# Patient Record
Sex: Male | Born: 1940
Health system: Southern US, Community
[De-identification: ages and names within clinical notes are randomized; demographics above are authoritative.]

## PROBLEM LIST (undated history)

## (undated) DIAGNOSIS — I10 Essential (primary) hypertension: Secondary | ICD-10-CM

## (undated) DIAGNOSIS — I2510 Atherosclerotic heart disease of native coronary artery without angina pectoris: Secondary | ICD-10-CM

## (undated) DIAGNOSIS — I7090 Unspecified atherosclerosis: Secondary | ICD-10-CM

## (undated) DIAGNOSIS — R451 Restlessness and agitation: Secondary | ICD-10-CM

## (undated) DIAGNOSIS — I498 Other specified cardiac arrhythmias: Secondary | ICD-10-CM

## (undated) DIAGNOSIS — F17200 Nicotine dependence, unspecified, uncomplicated: Secondary | ICD-10-CM

## (undated) DIAGNOSIS — E119 Type 2 diabetes mellitus without complications: Secondary | ICD-10-CM

## (undated) DIAGNOSIS — E785 Hyperlipidemia, unspecified: Secondary | ICD-10-CM

## (undated) DIAGNOSIS — I209 Angina pectoris, unspecified: Secondary | ICD-10-CM

## (undated) DIAGNOSIS — C069 Malignant neoplasm of mouth, unspecified: Secondary | ICD-10-CM

## (undated) DIAGNOSIS — Z951 Presence of aortocoronary bypass graft: Secondary | ICD-10-CM

## (undated) DIAGNOSIS — M1990 Unspecified osteoarthritis, unspecified site: Secondary | ICD-10-CM

## (undated) DIAGNOSIS — F329 Major depressive disorder, single episode, unspecified: Secondary | ICD-10-CM

## (undated) DIAGNOSIS — G309 Alzheimer's disease, unspecified: Secondary | ICD-10-CM

## (undated) DIAGNOSIS — F0280 Dementia in other diseases classified elsewhere without behavioral disturbance: Secondary | ICD-10-CM

## (undated) DIAGNOSIS — E538 Deficiency of other specified B group vitamins: Secondary | ICD-10-CM

## (undated) HISTORY — DX: Presence of aortocoronary bypass graft: Z95.1

## (undated) HISTORY — DX: Angina pectoris, unspecified: I20.9

## (undated) HISTORY — PX: OTHER SURGICAL HISTORY: SHX169

## (undated) HISTORY — DX: Atherosclerotic heart disease of native coronary artery without angina pectoris: I25.10

## (undated) HISTORY — DX: Unspecified osteoarthritis, unspecified site: M19.90

## (undated) HISTORY — DX: Deficiency of other specified B group vitamins: E53.8

## (undated) HISTORY — DX: Unspecified atherosclerosis: I70.90

## (undated) HISTORY — DX: Nicotine dependence, unspecified, uncomplicated: F17.200

## (undated) HISTORY — DX: Essential (primary) hypertension: I10

## (undated) HISTORY — DX: Malignant neoplasm of mouth, unspecified: C06.9

## (undated) HISTORY — DX: Other specified cardiac arrhythmias: I49.8

## (undated) HISTORY — DX: Type 2 diabetes mellitus without complications: E11.9

## (undated) HISTORY — DX: Restlessness and agitation: R45.1

## (undated) HISTORY — DX: Dementia in other diseases classified elsewhere, unspecified severity, without behavioral disturbance, psychotic disturbance, mood disturbance, and anxiety: F02.80

## (undated) HISTORY — DX: Hyperlipidemia, unspecified: E78.5

## (undated) HISTORY — PX: CORONARY ARTERY BYPASS GRAFT: SHX141

## (undated) HISTORY — DX: Alzheimer's disease, unspecified: G30.9

## (undated) HISTORY — DX: Major depressive disorder, single episode, unspecified: F32.9

---

## 2001-08-03 ENCOUNTER — Encounter: Admission: RE | Admit: 2001-08-03 | Discharge: 2001-08-03 | Payer: Self-pay | Admitting: Unknown Physician Specialty

## 2001-08-03 ENCOUNTER — Encounter: Payer: Self-pay | Admitting: Unknown Physician Specialty

## 2001-08-24 ENCOUNTER — Inpatient Hospital Stay (HOSPITAL_COMMUNITY): Admission: EM | Admit: 2001-08-24 | Discharge: 2001-08-26 | Payer: Self-pay | Admitting: Emergency Medicine

## 2004-12-07 ENCOUNTER — Ambulatory Visit: Payer: Self-pay

## 2005-09-30 ENCOUNTER — Ambulatory Visit: Payer: Self-pay | Admitting: Cardiology

## 2006-03-07 ENCOUNTER — Ambulatory Visit: Payer: Self-pay | Admitting: Cardiology

## 2006-10-03 ENCOUNTER — Ambulatory Visit: Payer: Self-pay

## 2007-04-10 ENCOUNTER — Ambulatory Visit: Payer: Self-pay | Admitting: Cardiology

## 2007-04-10 LAB — CONVERTED CEMR LAB
ALT: 17 units/L (ref 0–40)
AST: 20 units/L (ref 0–37)
Albumin: 3.5 g/dL (ref 3.5–5.2)
Alkaline Phosphatase: 41 units/L (ref 39–117)
Bilirubin, Direct: 0.1 mg/dL (ref 0.0–0.3)
Cholesterol: 119 mg/dL (ref 0–200)
HDL: 40.3 mg/dL (ref >39.0)
LDL Cholesterol: 65 mg/dL (ref 0–99)
Total Bilirubin: 1.1 mg/dL (ref 0.3–1.2)
Total CHOL/HDL Ratio: 3
Total Protein: 6 g/dL (ref 6.0–8.3)
Triglycerides: 69 mg/dL (ref 0–149)
VLDL: 14 mg/dL (ref 0–40)

## 2007-10-02 ENCOUNTER — Ambulatory Visit: Payer: Self-pay

## 2008-04-22 ENCOUNTER — Ambulatory Visit: Payer: Self-pay | Admitting: Cardiology

## 2009-03-14 ENCOUNTER — Telehealth: Payer: Self-pay | Admitting: Cardiology

## 2009-04-12 ENCOUNTER — Encounter: Payer: Self-pay | Admitting: Cardiology

## 2009-06-26 ENCOUNTER — Encounter: Payer: Self-pay | Admitting: Cardiology

## 2009-06-26 DIAGNOSIS — Z951 Presence of aortocoronary bypass graft: Secondary | ICD-10-CM | POA: Insufficient documentation

## 2009-06-26 DIAGNOSIS — E785 Hyperlipidemia, unspecified: Secondary | ICD-10-CM | POA: Insufficient documentation

## 2009-06-26 DIAGNOSIS — I2510 Atherosclerotic heart disease of native coronary artery without angina pectoris: Secondary | ICD-10-CM | POA: Insufficient documentation

## 2009-06-26 HISTORY — DX: Presence of aortocoronary bypass graft: Z95.1

## 2009-06-26 HISTORY — DX: Atherosclerotic heart disease of native coronary artery without angina pectoris: I25.10

## 2009-06-26 HISTORY — DX: Hyperlipidemia, unspecified: E78.5

## 2009-06-27 ENCOUNTER — Ambulatory Visit: Payer: Self-pay | Admitting: Cardiology

## 2009-07-04 ENCOUNTER — Telehealth (INDEPENDENT_AMBULATORY_CARE_PROVIDER_SITE_OTHER): Payer: Self-pay | Admitting: *Deleted

## 2009-07-05 ENCOUNTER — Encounter: Payer: Self-pay | Admitting: Cardiology

## 2009-07-05 ENCOUNTER — Ambulatory Visit: Payer: Self-pay

## 2009-07-12 ENCOUNTER — Telehealth: Payer: Self-pay | Admitting: Cardiology

## 2009-07-23 ENCOUNTER — Encounter: Payer: Self-pay | Admitting: Cardiology

## 2009-07-24 ENCOUNTER — Ambulatory Visit: Payer: Self-pay | Admitting: Cardiology

## 2010-10-02 ENCOUNTER — Ambulatory Visit: Payer: Self-pay | Admitting: Cardiology

## 2010-10-02 DIAGNOSIS — F17200 Nicotine dependence, unspecified, uncomplicated: Secondary | ICD-10-CM

## 2010-10-02 DIAGNOSIS — I498 Other specified cardiac arrhythmias: Secondary | ICD-10-CM | POA: Insufficient documentation

## 2010-10-02 HISTORY — DX: Other specified cardiac arrhythmias: I49.8

## 2010-10-02 HISTORY — DX: Nicotine dependence, unspecified, uncomplicated: F17.200

## 2010-10-19 ENCOUNTER — Ambulatory Visit: Payer: Self-pay

## 2010-11-13 ENCOUNTER — Encounter: Payer: Self-pay | Admitting: Cardiology

## 2010-12-06 NOTE — Assessment & Plan Note (Signed)
Summary: f1y   Visit Type:  Follow-up Primary Provider:  Junious Dresser, MD  CC:  CAD.  History of Present Illness: The patient is seen for followup of coronary artery disease.  He underwent CABG in 1994.  He received PCI to 2 vessels in 2002.  Nuclear scan in 2008 and September, 2010 revealed no ischemia.  The patient does have aches and pains currently.  He has significant neck pain and he is receiving pain meds for this.  He has knee pain also.  This does not sound like claudication.  He has pain in his left anterior chest that occurs intermittently.  It is very short-lived "jolt of pain" that does not last for a long time.  I doubt that this represents ischemia.  Current Medications (verified): 1)  Lisinopril 10 Mg Tabs (Lisinopril) .... Take 1 Tablet By Mouth Once A Day 2)  Nexium 40 Mg Cpdr (Esomeprazole Magnesium) .... Take One Tablet By Mouth Once Daily. 3)  Tramadol Hcl 50 Mg Tabs (Tramadol Hcl) .... Prn 4)  Simvastatin 10 Mg Tabs (Simvastatin) .... Take One Tablet By Mouth Daily At Bedtime 5)  Aspirin 81 Mg Tbec (Aspirin) .... Take One Tablet By Mouth Daily 6)  Namenda 10 Mg Tabs (Memantine Hcl) .... Once Daily 7)  Hydrocodone-Acetaminophen 5-500 Mg Tabs (Hydrocodone-Acetaminophen) .... Every 6 Hrs As Needed 8)  Nitrostat 0.4 Mg Subl (Nitroglycerin) .Marland Kitchen.. 1 Tablet Under Tongue At Onset of Chest Pain; You May Repeat Every 5 Minutes For Up To 3 Doses.  Allergies (verified): No Known Drug Allergies  Past History:  Past Medical History: CAD....PCI  2002 ..(.RCA & Cx).......nuclear normal 09/2007.. / nuclear September, 2010.Marland Kitchen abnormal EKGs but no ischemia.Marland Kitchen ejection fraction 55% CABG  1994 Dyslipidemia LV  normal  by nuclear 2008 Arthritic pain in multiple joints  including neck and knees.... November, 2011 Tobacco abuse  Review of Systems       Patient denies fever, chills, headache, sweats, rash, change in vision, change in hearing, cough, nausea vomiting, urinary  symptoms.  All other systems are reviewed and are negative.  Vital Signs:  Patient profile:   70 year old male Height:      68 inches Weight:      154 pounds BMI:     23.50 Pulse rate:   52 / minute BP sitting:   160 / 70  (left arm) Cuff size:   regular  Vitals Entered By: Hardin Negus, RMA (October 02, 2010 9:15 AM)   Physical Exam  General:  patient is stable. Head:  head is atraumatic. Eyes:  no xanthelasma. Neck:  no jugular venous distention.  Questionable soft left carotid bruit. Chest Wall:  no chest wall tenderness. Lungs:  lungs are clear.  Respiratory effort is nonlabored. Heart:  cardiac exam reveals S1 and S2.  No clicks or significant murmurs. Abdomen:  abdomen is soft. Msk:  no musculoskeletal deformities. Extremities:  no peripheral edema. Skin:  no skin rashes Psych:  patient is oriented to person time and place affect is normal.  He is here with his wife today.   Impression & Recommendations:  Problem # 1:  * SOFT LEFT CAROTID BRUIT the patient does have a soft left carotid bruit.  I have no record of carotid Dopplers being done in many years.  Carotid Doppler will be arranged.  Problem # 2:  BRADYCARDIA (ICD-427.89)  His updated medication list for this problem includes:    Lisinopril 10 Mg Tabs (Lisinopril) .Marland Kitchen... Take 1 tablet by mouth  once a day    Aspirin 81 Mg Tbec (Aspirin) .Marland Kitchen... Take one tablet by mouth daily    Nitrostat 0.4 Mg Subl (Nitroglycerin) .Marland Kitchen... 1 tablet under tongue at onset of chest pain; you may repeat every 5 minutes for up to 3 doses.  The patient has sinus bradycardia.  Is not having symptoms.  He is not on medications that will slow his heart rate.  No change in therapy.  Problem # 3:  * CHEST WALL PAIN This chest pain does not appear to be ischemic.  No further workup.  Problem # 4:  DYSLIPIDEMIA (ICD-272.4)  The following medications were removed from the medication list:    Lipitor 10 Mg Tabs (Atorvastatin calcium)  .Marland Kitchen... Take one tablet by mouth daily. His updated medication list for this problem includes:    Simvastatin 10 Mg Tabs (Simvastatin) .Marland Kitchen... Take one tablet by mouth daily at bedtime The patient is on medication for his lipids.this is followed by his primary physician.  Problem # 5:  CAD (ICD-414.00)  His updated medication list for this problem includes:    Lisinopril 10 Mg Tabs (Lisinopril) .Marland Kitchen... Take 1 tablet by mouth once a day    Aspirin 81 Mg Tbec (Aspirin) .Marland Kitchen... Take one tablet by mouth daily    Nitrostat 0.4 Mg Subl (Nitroglycerin) .Marland Kitchen... 1 tablet under tongue at onset of chest pain; you may repeat every 5 minutes for up to 3 doses.  Orders: EKG w/ Interpretation (93000) Coronary disease is stable.  He had a nuclear exercise study in 2010 that showed no ischemia.  LV function has been normal by history and at the time of his nuclear studies.  EKG is done today and reviewed by me.  There is sinus bradycardia.  No other abnormalities are noted.  Carotid Doppler will be obtained.  We will be in touch with him about the information.  We'll see him back in one year for cardiology followup.  Problem # 6:  TOBACCO ABUSE (ICD-305.1) The patient is using tobacco products.  He is counseled to stop.  Other Orders: Carotid Duplex (Carotid Duplex)  Patient Instructions: 1)  Your physician has requested that you have a carotid duplex. This test is an ultrasound of the carotid arteries in your neck. It looks at blood flow through these arteries that supply the brain with blood. Allow one hour for this exam. There are no restrictions or special instructions. 2)  Your physician wants you to follow-up in:  1 year.  You will receive a reminder letter in the mail two months in advance. If you don't receive a letter, please call our office to schedule the follow-up appointment.

## 2011-03-19 NOTE — Assessment & Plan Note (Signed)
[facility]                            CARDIOLOGY OFFICE NOTE   NAME:Brandon Shepherd, Brandon Shepherd                      MRN:          [id number]  DATE:04/22/2008                            DOB:          Jun 17, 1941    Brandon Shepherd has coronary disease.  He had been followed by Dr. Samule Ohm of  our group who has moved to [place].  I will provide his cardiology  care going forward.  Brandon Shepherd underwent CABG in 1994 and later had  stenting of a native right coronary artery and circumflex in October  2002.  He does very well.  He is quite stable.  His last Myoview scan  was done in November 2008, and showed no ischemia.  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  He has not had any chest pain.  He has no shortness of breath.  He has  some arthritis in his hands and his elbows.  He has had no syncope or  presyncope.   PAST MEDICAL HISTORY:   ALLERGIES:  No known drug allergies.   MEDICATIONS:  1. Lipitor 10.  2. Aspirin 81.  3. Fish oil.  4. Nexium.  We will reverify whether he is or is not on an ACE inhibitor.   OTHER MEDICAL PROBLEMS:  See the list below.   REVIEW OF SYSTEMS:  Other than the HPI.  He really has no significant  complaints.   REVIEW OF SYSTEMS:  Otherwise is negative.   PHYSICAL EXAMINATION:  VITAL SIGNS:  Weight is 142 pounds, which is down  a few pounds since last year.  Blood pressure is 103/58 with a pulse of  53.  GENERAL:  The patient is oriented to person, time, and place.  Affect is normal.  HEENT:  No xanthelasma.  He has normal extraocular motion.  NECK:  There are no carotid bruits.  There is no jugular venous  distention.  LUNGS:  Clear.  Respiratory effort is not labored.  CARDIAC:  S1 with an S2.  There are no clicks or significant murmurs.  ABDOMEN:  Soft.  EXTREMITIES:  He has no peripheral edema.  He does have some bony  changes of osteoarthritis in his hands.   EKG  reveals sinus bradycardia.  There are no significant QRS changes.   PROBLEMS:  1. Coronary disease post coronary artery bypass graft and subsequent      percutaneous coronary intervention.  He is stable.  He does not      have any significant symptoms.  He will have his next exercise test      done, if necessary based on his need for a DOT evaluation.  2. Hypercholesterolemia.  His Lipitor dose has been lowered because of      some symptoms.  His labs are followed in [place].  Of course, we      would hope to try to keep to get his LDL into the 70-80 range, if      possible.   Brandon Shepherd is stable.  He does not  need any further workup at this  time.     Luis Abed, MD, [facility]  Electronically Signed    JDK/MedQ  DD: 04/22/2008  DT: 04/22/2008  Job #: [id number]   cc:   Lewis Moccasin

## 2011-03-19 NOTE — Assessment & Plan Note (Signed)
[facility]                            CARDIOLOGY OFFICE NOTE   NAME:Brandon Shepherd, Brandon Shepherd                      MRN:          [id number]  DATE:04/10/2007                            DOB:          1941/08/16    PRIMARY CARE PHYSICIAN:  Dr. Lewis Moccasin.   HISTORY OF PRESENT ILLNESS:  Brandon Shepherd is a 70 year old gentleman who  is status post coronary artery bypass grafting in 1994 and subsequent  stenting of the native right coronary and circumflex arteries in October  2002. He continues to do very well. He works driving his truck. He has  not had any episodes of chest discomfort, PND, orthopnea, edema,  syncope, presyncope or symptoms concerning for TIA or stroke.   He has recently had a prostatitis from which he is recovering nicely.   CURRENT MEDICATIONS:  1. Nexium 40 mg daily.  2. Enteric coated aspirin 81 mg daily.  3. Vitamin C.  4. Calcium.  5. Vitamin D.  6. Lipitor 20 mg daily.  7. Altace 5 mg daily.  8. Levaquin 750 mg daily.   PHYSICAL EXAMINATION:  GENERAL:  He is generally well-appearing and thin  in no distress.  VITAL SIGNS:  Heart rate 55, blood pressure 98/60 and weight of 148  pounds. Weight is down 14 pounds from 18 months ago.  NECK:  He has no jugular venous distention, thyromegaly or  lymphadenopathy.  LUNGS:  Respiratory effort is normal. Lungs are clear to auscultation.  CARDIAC:  He has a nondisplaced point of maximal cardiac impulse. There  is a regular rate and rhythm without murmurs, rubs or gallops.  ABDOMEN:  Soft, nondistended, nontender. There is no hepatosplenomegaly.  Bowel sounds are normal. There is no evidence of a pulsatile mass.  EXTREMITIES:  Warm and without edema.  PULSES:  Carotid pulses are 2+ bilaterally without bruits.   IMPRESSION/RECOMMENDATIONS:  1. Coronary disease:  Doing nicely after a coronary artery bypass      graft and subsequent percutaneous revascularization of the right      coronary and  circumflex. Ejection fraction is normal. Will continue      aspirin. To save him money, we will switch from Altace to      lisinopril 10 mg daily.  2. Hypercholesterolemia:  Check lipids and LFTs today. Will switch      from Lipitor to Simvastatin 40 mg daily for cost savings.     Salvadore Farber, MD  Electronically Signed    WED/MedQ  DD: 04/10/2007  DT: 04/10/2007  Job #: [id number]   cc:   Lewis Moccasin

## 2011-03-22 NOTE — Cardiovascular Report (Signed)
[place]. [facility]  Patient:    Brandon Shepherd, Brandon Shepherd Visit Number: [id number] MRN: [id number]          Service Type: MED Location: [phone] Attending Physician:  Daisey Must Dictated by:   Everardo Beals Juanda Chance, M.D. [facility] Proc. Date: 08/25/01 Admit Date:  08/24/2001   CC:         Daisey Must, M.D. [facility]  Cardiopulmonary Laboratory   Cardiac Catheterization  PROCEDURES PERFORMED: Cardiac catheterization and percutaneous coronary intervention.  CLINICAL HISTORY: The patient is 70 years old and had bypass surgery in 1996. He has had symptoms of exertional angina for several weeks and recently had a Cardiolite scan which showed inferior ischemia. He was admitted through the office by Jerral Bonito and Dian Queen yesterday.  DESCRIPTION OF PROCEDURE: The procedure was performed via the right femoral artery using an arterial sheath and 6 French preformed coronary catheters.  A front wall arterial puncture was performed and Omnipaque contrast was used. A LIMA catheter was used for injection of the LIMA graft. A left bypass graft catheter was used for injection of the vein graft to the diagonal and ramus and a 7 [place] guiding catheter with side holes was used for injection of the right coronary artery. After completion of the diagnostic study, we made a decision to proceed with intervention on the right coronary artery and distal circumflex artery.  The patient was given weight-adjusted heparin to prolong the ACT to greater than 200 seconds and was given double bolus Integrilin and infusion. We used a 7 [place] guiding catheter with side holes and a short luge wire.  We crossed the lesion in the mid to right coronary artery without much difficulty.  We initially pre-dilated with a 2.25 x 20 mm Maverick performing one inflation of 8 atmospheres for 40 seconds.  We then deployed a 2.5 x 28 mm Pixel stent with one inflation of 12 atmospheres for 53  seconds.  Repeat diagnostic studies were then performed with the guiding catheter.  We then approached the circumflex artery. We used a 7 [place] 4.0 Voda guiding catheter with side holes. We used a short luge wire. We crossed the lesion with the wire without much difficulty. We initially pre-dilated with the 2.25 x 23 mm Maverick performing one inflation of 8 atmospheres for 31 seconds.  We then deployed a 2.5 x 12 mm Express stent. The deployment was very difficult because the stent had marked motion prior to deployment. Following deployment of the stent, the stent was deployed more distally than intended and we had to pass a second 2.75 x 8 mm Express stent. We deployed this overlapping the first stent. This also was difficult to deploy because of motion of the stent within the vessel and we got it slightly more distal than desired but felt that we covered the lesion.  The patient tolerated the procedure well and left the laboratory in satisfactory condition.  RESULTS: The aortic pressure was 121/70 with a mean of 88. Left ventricular pressure was 121/15.  The left main coronary artery: The left main coronary artery was free of significant disease.  Left anterior descending: The left anterior descending artery was completely occluded.  Circumflex artery: The circumflex artery was a codominant vessel that gave rise to a ramus branch which was completely occluded, a small marginal branch, a second marginal branch, and a large posterolateral branch. There was 70% narrowing in the small marginal branch. There was 90% narrowing in the distal  circumflex artery before the posterolateral branch.  The saphenous vein graft to the diagonal branch of the LAD and intermediate branch of the circumflex artery was patent without stenoses. There was 70% narrowing just distal to the anastomosis of the ramus branch.  The LIMA graft to the LAD was patent and functioned well and there was  no significant disease in the distal LAD.  LEFT VENTRICULOGRAPHY: The left ventriculogram performed in the RAO projection showed good wall motion with no areas of hypokinesis. The estimated ejection fraction was 60%.  Following stenting of the mid to distal right coronary lesion the stenosis improved from 90% to 0%.  Following placement of tandem overlying stents in the distal circumflex artery the stenosis improved from 90% to less than 15%.  CONCLUSIONS: 1. Coronary artery disease, status post coronary artery bypass graft surgery    in 1996 with total occlusion of the native left anterior descending, 70%    narrowing in the first marginal branch of the circumflex artery and 90%    stenosis in the distal circumflex artery, 90% stenosis in the mid to distal    right coronary artery, a patent vein graft to the diagonal branch of the    left anterior descending and the ramus branch of the circumflex artery with    70% narrowing after the anastomosis to the ramus branch, and a patent    left internal mammary artery graft to the left anterior descending with    good left ventricular function. 2. Successful stent deployment in the mid to distal right coronary artery with    improvement in percent diameter narrowing from 90% to 0%. 3. Successful placement of tandem overlying stents in the distal circumflex    artery with improvement in percent diameter narrowing from 90% to less    than 15%.  DISPOSITION: The patient was returned to the postangioplasty unit for further observation. The patient will have a higher intense of recurrence at followup because of the long length of the stent in the right coronary artery with a fairly small diameter vessel and because of the need to place tandem overlying stents in the distal circumflex artery. Dictated by:   Everardo Beals Juanda Chance, M.D. [facility] Attending Physician:  Daisey Must DD:  08/25/01 TD:  08/26/01 Job: [id number]

## 2011-03-22 NOTE — Discharge Summary (Signed)
[place]. [facility]  Patient:    Brandon Shepherd, Brandon Shepherd Visit Number: [id number] MRN: [id number]          Service Type: MED Location: [phone] Attending Physician:  Daisey Must Dictated by:   Tereso Newcomer, P.A. Admit Date:  08/24/2001 Disc. Date: 08/26/01   CC:         Dr. Barnabas Lister                           Discharge Summary  DATE OF BIRTH:  [birth year]  REASON FOR ADMISSION:  Unstable angina.  DISCHARGE DIAGNOSES: 1. Coronary artery disease. 2. Status post coronary artery bypass grafting in 1994 by Gwenith Daily. Tyrone Sage,    M.D., with left internal mammary artery to left anterior descending and    sequential vein graft to the first and second diagonal branches. 3. Status post stenting to the native right coronary artery and status post    stenting to the native circumflex this admission by Bruce R. Juanda Chance, M.D. 4. Gastroesophageal reflux disease. 5. Ex-smoker.  PROCEDURES PERFORMED THIS ADMISSION: 1. Cardiac catheterization by Bruce R. Juanda Chance, M.D., on August 25, 2001. 2. Percutaneous coronary intervention by Bruce R. Juanda Chance, M.D., on August 25, 2001.  HISTORY OF PRESENT ILLNESS:  This 70 year old male was seen in the office on August 12, 2001.  At that time, he had complaints of mid scapular back pain that radiated to his neck and arms.  At that time, he was evaluated with a stress Cardiolite.  It was also noted that he had no antihypertensive therapy or lipid-lowering therapy.  He was started on an ACE inhibitor at 2.5 mg at that time.  The patients stress Cardiolite was positive for ischemia.  He had also noted increasing symptoms.  HOSPITAL COURSE:  Therefore, he was admitted, placed on heparin, and scheduled for cardiac catheterization.  He was also placed on Lopressor 25 mg a day, as well as Plavix.  He underwent cardiac catheterization by Bruce R. Juanda Chance, M.D., on August 25, 2001.  This revealed a totally occluded LAD, circumflex with  totally occluded intermediate and 90% distal stenosis, RCA with 90% distal stenosis, SVG to diagonal and intermediate okay with 70% intermediate stenosis, LIMA to LAD okay, and his LV was normal with an EF of 60%.  He subsequently underwent percutaneous coronary intervention as above to the RCA and circumflex.  The stenosis in the RCA was reduced from 90% to 0% with stenting and the circumflex stenosis was reduced from 90% to less than 15% with stenting.  The patient tolerated the procedure well and had no complications.  On the morning of August 26, 2001, he was found to be in stable condition without any chest pain or shortness of breath.  His EKG was normal and his post procedure enzymes were negative.  His right groin was without hematoma or bruits.  Daisey Must, M.D., saw the patient and planned to continue him on aspirin and Plavix for six months.  He changed his Altace to 5 mg a day and changed his Lopressor to Toprol XL 50 mg a day.  He would need LFTs and lipids checked in one week at our office.  He would continue on his PPI for his gastroesophageal reflux disease.  LABORATORY DATA:  White blood cell count 7100, hemoglobin 13.6, hematocrit 39, platelet count 203,000.  INR 1.0.  Sodium 138, potassium 3.5, chloride 106, CO2 28, glucose 93, BUN 9,  creatinine 1, calcium 8.8.  Cardiac enzymes negative x 3.  TSH 1.230.  DISCHARGE MEDICATIONS: 1. Coated aspirin 325 mg q.d. 2. Plavix 75 mg q.d. x 6 months. 3. Altace 5 mg q.d. 4. Toprol XL 50 mg q.d. 5. Nexium 40 mg q.d. 6. Nitroglycerin p.r.n. chest pain.  ACTIVITY:  No heavy lifting, driving, or exertion for three days.  He is to return to work on September 07, 2001.  DIET:  Low fat, low sodium.  WOUND CARE:  He is to call the office for any groin swelling, bleeding, or bruising.  FOLLOW-UP:  He will see the physician assistant on Thursday, September 03, 2001, at 8:30 a.m.  He has been advised to not eat anything after  midnight on September 02, 2001, for lipid profile to be checked on September 03, 2001.  He also needs a blood pressure check in addition to the LFTs and lipid profile. He will see Daisey Must, M.D., on Thursday, October 08, 2001, at 9:30 a.m. Dictated by:   Tereso Newcomer, P.A. Attending Physician:  Daisey Must DD:  08/26/01 TD:  08/26/01 Job: [id number]

## 2011-05-01 ENCOUNTER — Telehealth: Payer: Self-pay | Admitting: Cardiology

## 2011-05-02 ENCOUNTER — Telehealth: Payer: Self-pay | Admitting: Cardiology

## 2011-05-02 NOTE — Telephone Encounter (Signed)
Cath faxed to [facility] @ [phone] 05/02/11/km

## 2011-09-04 DIAGNOSIS — C069 Malignant neoplasm of mouth, unspecified: Secondary | ICD-10-CM | POA: Insufficient documentation

## 2011-09-04 DIAGNOSIS — K1379 Other lesions of oral mucosa: Secondary | ICD-10-CM | POA: Insufficient documentation

## 2011-09-04 DIAGNOSIS — M26629 Arthralgia of temporomandibular joint, unspecified side: Secondary | ICD-10-CM | POA: Insufficient documentation

## 2011-10-23 DIAGNOSIS — M542 Cervicalgia: Secondary | ICD-10-CM | POA: Insufficient documentation

## 2011-11-16 ENCOUNTER — Other Ambulatory Visit: Payer: Self-pay | Admitting: Cardiology

## 2011-12-09 DIAGNOSIS — K122 Cellulitis and abscess of mouth: Secondary | ICD-10-CM | POA: Insufficient documentation

## 2012-02-20 ENCOUNTER — Other Ambulatory Visit: Payer: Self-pay | Admitting: Cardiology

## 2012-05-24 ENCOUNTER — Other Ambulatory Visit: Payer: Self-pay | Admitting: Cardiology

## 2012-06-23 ENCOUNTER — Other Ambulatory Visit: Payer: Self-pay | Admitting: Cardiology

## 2012-07-13 ENCOUNTER — Other Ambulatory Visit: Payer: Self-pay | Admitting: Cardiology

## 2012-07-29 ENCOUNTER — Other Ambulatory Visit: Payer: Self-pay | Admitting: Cardiology

## 2012-08-01 ENCOUNTER — Other Ambulatory Visit: Payer: Self-pay | Admitting: Cardiology

## 2015-02-06 ENCOUNTER — Inpatient Hospital Stay
Admission: AD | Admit: 2015-02-06 | Payer: Self-pay | Source: Other Acute Inpatient Hospital | Admitting: Cardiovascular Disease

## 2016-01-02 DIAGNOSIS — F0390 Unspecified dementia without behavioral disturbance: Secondary | ICD-10-CM | POA: Insufficient documentation

## 2016-01-02 DIAGNOSIS — R270 Ataxia, unspecified: Secondary | ICD-10-CM | POA: Insufficient documentation

## 2016-01-02 DIAGNOSIS — G9340 Encephalopathy, unspecified: Secondary | ICD-10-CM | POA: Insufficient documentation

## 2016-01-02 NOTE — Telephone Encounter (Signed)
error 

## 2016-01-05 DIAGNOSIS — F0391 Unspecified dementia with behavioral disturbance: Secondary | ICD-10-CM | POA: Insufficient documentation

## 2016-01-05 DIAGNOSIS — F03918 Unspecified dementia, unspecified severity, with other behavioral disturbance: Secondary | ICD-10-CM | POA: Insufficient documentation

## 2016-01-06 DIAGNOSIS — Z87448 Personal history of other diseases of urinary system: Secondary | ICD-10-CM | POA: Insufficient documentation

## 2016-01-07 DIAGNOSIS — I10 Essential (primary) hypertension: Secondary | ICD-10-CM | POA: Insufficient documentation

## 2016-01-13 DIAGNOSIS — J690 Pneumonitis due to inhalation of food and vomit: Secondary | ICD-10-CM | POA: Insufficient documentation

## 2016-01-14 DIAGNOSIS — E870 Hyperosmolality and hypernatremia: Secondary | ICD-10-CM | POA: Insufficient documentation

## 2016-01-14 DIAGNOSIS — J189 Pneumonia, unspecified organism: Secondary | ICD-10-CM | POA: Insufficient documentation

## 2016-11-20 DIAGNOSIS — G309 Alzheimer's disease, unspecified: Secondary | ICD-10-CM | POA: Diagnosis not present

## 2016-11-20 DIAGNOSIS — Z8679 Personal history of other diseases of the circulatory system: Secondary | ICD-10-CM | POA: Diagnosis not present

## 2016-11-21 DIAGNOSIS — R531 Weakness: Secondary | ICD-10-CM | POA: Diagnosis not present

## 2016-11-22 DIAGNOSIS — G309 Alzheimer's disease, unspecified: Secondary | ICD-10-CM | POA: Diagnosis not present

## 2016-11-22 DIAGNOSIS — Z8679 Personal history of other diseases of the circulatory system: Secondary | ICD-10-CM | POA: Diagnosis not present

## 2016-11-25 ENCOUNTER — Telehealth: Payer: Self-pay

## 2016-11-25 NOTE — Telephone Encounter (Signed)
SENT NOTES TO SCHEDULING 

## 2016-11-27 DIAGNOSIS — G309 Alzheimer's disease, unspecified: Secondary | ICD-10-CM | POA: Diagnosis not present

## 2016-11-27 DIAGNOSIS — Z8679 Personal history of other diseases of the circulatory system: Secondary | ICD-10-CM | POA: Diagnosis not present

## 2016-11-29 DIAGNOSIS — Z8679 Personal history of other diseases of the circulatory system: Secondary | ICD-10-CM | POA: Diagnosis not present

## 2016-11-29 DIAGNOSIS — G309 Alzheimer's disease, unspecified: Secondary | ICD-10-CM | POA: Diagnosis not present

## 2016-12-05 DIAGNOSIS — Z8679 Personal history of other diseases of the circulatory system: Secondary | ICD-10-CM | POA: Diagnosis not present

## 2016-12-05 DIAGNOSIS — G309 Alzheimer's disease, unspecified: Secondary | ICD-10-CM | POA: Diagnosis not present

## 2016-12-06 ENCOUNTER — Ambulatory Visit: Payer: Self-pay | Admitting: Physician Assistant

## 2016-12-09 DIAGNOSIS — Z8679 Personal history of other diseases of the circulatory system: Secondary | ICD-10-CM | POA: Diagnosis not present

## 2016-12-09 DIAGNOSIS — G309 Alzheimer's disease, unspecified: Secondary | ICD-10-CM | POA: Diagnosis not present

## 2016-12-11 ENCOUNTER — Ambulatory Visit (INDEPENDENT_AMBULATORY_CARE_PROVIDER_SITE_OTHER): Payer: Medicare Other | Admitting: Physician Assistant

## 2016-12-11 ENCOUNTER — Encounter (INDEPENDENT_AMBULATORY_CARE_PROVIDER_SITE_OTHER): Payer: Self-pay

## 2016-12-11 ENCOUNTER — Other Ambulatory Visit: Payer: Self-pay | Admitting: *Deleted

## 2016-12-11 ENCOUNTER — Encounter: Payer: Self-pay | Admitting: Physician Assistant

## 2016-12-11 VITALS — BP 130/70 | HR 59 | Ht 68.0 in | Wt 141.0 lb

## 2016-12-11 DIAGNOSIS — I25708 Atherosclerosis of coronary artery bypass graft(s), unspecified, with other forms of angina pectoris: Secondary | ICD-10-CM | POA: Diagnosis not present

## 2016-12-11 DIAGNOSIS — T8189XA Other complications of procedures, not elsewhere classified, initial encounter: Secondary | ICD-10-CM

## 2016-12-11 DIAGNOSIS — L03313 Cellulitis of chest wall: Secondary | ICD-10-CM | POA: Diagnosis not present

## 2016-12-11 DIAGNOSIS — I779 Disorder of arteries and arterioles, unspecified: Secondary | ICD-10-CM | POA: Diagnosis not present

## 2016-12-11 DIAGNOSIS — I739 Peripheral vascular disease, unspecified: Secondary | ICD-10-CM

## 2016-12-11 DIAGNOSIS — Z8679 Personal history of other diseases of the circulatory system: Secondary | ICD-10-CM | POA: Diagnosis not present

## 2016-12-11 NOTE — Progress Notes (Signed)
Cardiology Office Note    Date:  12/11/2016   ID:  [name], DOB 09-09-1941, MRN [id number]  PCP:  Helen Hashimoto., MD  Cardiologist:  New (previous patient of Dr. Ron Parker - last seen in 2011)  Chief Complaint: Chest pain   History of Present Illness:   Brandon Shepherd is a 76 y.o. male CAD s/p CABG and PCI, sinus bradycardia (not on any AV blocking agent), HLD, carotid artery disease,  prior tobacco abuse (quit 23 years ago), dementia, diastolic CHF, and stroke presents for evaluation of subcutaneous abscess on previous CABG site.  He underwent CABG in 1994.  He received PCI to 2 vessels in 2002 (RCA and Cx).  Nuclear scan in 2008 and September, 2010 revealed no ischemia. Carotid doppler 11/2010 showed 123456 RICA & [id number] LICA stenosis. Carotid doppler 03/2015  Moderate plaque in both R & L carotids.   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  Echo 01/2016 showed normal EF with mild MR and TR.   He was treated 09/18/2016 with antibiotic (Augmentin for 10 days) for subcutaneous abscess at upper sternum @ site of rib cage wire from previous surgery site. His symptoms started approximately 3-4 months ago. Initially he has a redness, swelling, slight pus and extreme tender to palpation. His symptoms has been improved significantly after one round of antibiotic as described above. Now only has erythema around the surgical site. For past 4 weeks, Surgical wire has came out. No fever or chills.   Patient has a severe dementia and Alzheimer disease. Lives with family and states that patient is on  his feet at home. He frequently runs into walls and doors due to dementia. No fall. Intermittent complaints of chest pain underneath his left breast. Patient is mostly quiet during my evaluation. History of obtained from son and wife. Denies shortness of breath, orthopnea, PND, syncope, lower  extremity edema, melena or blood in his urine.   Past Medical History:  Diagnosis Date  . Agitation   . Alzheimer disease   . Angina pectoris ([place])   . Atherosclerosis   . BRADYCARDIA 10/02/2010   Qualifier: Diagnosis of  By: Ron Parker, MD, Caffie Damme   . CAD 06/26/2009   Qualifier: Diagnosis of  By: Ron Parker, MD, Leonidas Romberg Dorinda Hill   . CORONARY ARTERY BYPASS GRAFT, HX OF 06/26/2009   Qualifier: Diagnosis of  By: Ron Parker, MD, Leonidas Romberg Dorinda Hill   . Diabetes mellitus without complication ([place])   . DYSLIPIDEMIA 06/26/2009   Qualifier: Diagnosis of  By: Ron Parker, MD, Leonidas Romberg Dorinda Hill   . Hypertension   . Major depression   . Mouth cancer ([place])   . Osteoarthrosis   . TOBACCO ABUSE 10/02/2010   Qualifier: Diagnosis of  By: Ron Parker, MD, Leonidas Romberg Dorinda Hill   . Vitamin B12 deficiency     Past Surgical History:  Procedure Laterality Date  . CORONARY ARTERY BYPASS GRAFT    . oral cancer      Current Medications:  Prior to Admission medications   Medication Sig Start Date End Date Taking? Authorizing Provider  citalopram (CELEXA) 40 MG tablet Take 40 mg by mouth daily.   Yes Historical Provider, MD  donepezil (ARICEPT) 10 MG tablet Take 10 mg by mouth at bedtime.   Yes Historical Provider, MD  memantine (NAMENDA) 10 MG tablet Take 10 mg by mouth 2 (two) times daily.  Yes Historical Provider, MD  pantoprazole (PROTONIX) 40 MG tablet Take 40 mg by mouth daily.   Yes Historical Provider, MD  nitroGLYCERIN (NITROLINGUAL) 0.4 MG/SPRAY spray Place 1 spray under the tongue every 5 (five) minutes x 3 doses as needed for chest pain.    Historical Provider, MD    Allergies:   Lorazepam   Social History   Social History  . Marital status: Married    Spouse name: N/A  . Number of children: N/A  . Years of education: N/A   Social History Main Topics  . Smoking status: Never Smoker  . Smokeless tobacco: Never Used  . Alcohol use Yes     Comment: moderate  . Drug use: No  . Sexual  activity: Yes     Comment: married   Other Topics Concern  . None   Social History Narrative  . None     Family History:  The patient's family history includes CAD in his mother.   ROS:   Please see the history of present illness.    ROS All other systems reviewed and are negative.   PHYSICAL EXAM:   VS:  BP 130/70   Pulse (!) 59   Ht 5\' 8"  (1.727 m)   Wt 141 lb (64 kg)   BMI 21.44 kg/m    GEN: thin frail male in no acute distress  HEENT: normal  Neck: no JVD,, or masses. Soft left carotid bruit Cardiac: RRR; no murmurs, rubs, or gallops,no edema. Upper sternal surgical wire came out (~66mm) with erythema around. No edema or pus draining.  Respiratory:  clear to auscultation bilaterally, normal work of breathing GI: soft, nontender, nondistended, + BS MS: no deformity or atrophy  Skin: warm and dry, no rash Neuro:  Dementia Psych: slow to response.  Wt Readings from Last 3 Encounters:  12/11/16 141 lb (64 kg)  10/02/10 154 lb (69.9 kg)  07/24/09 150 lb (68 kg)      Studies/Labs Reviewed:   EKG:  EKG is ordered today.  The ekg ordered today demonstrates Sinus  Bradycardia at rate of 59 bpm.   Recent Labs: No results found for requested labs within last 8760 hours.   Lipid Panel    Component Value Date/Time   CHOL 119 04/10/2007 0952   TRIG 69 04/10/2007 0952   HDL 40.3 04/10/2007 0952   CHOLHDL 3.0 CALC 04/10/2007 0952   VLDL 14 04/10/2007 0952   LDLCALC 65 04/10/2007 0952    Additional studies/ records that were reviewed today include:   As above  Carotid doppler 03/2015  RIGHT No significant stenotic flow. ICA: Moderate plaque. ECA: Minimal plaque. VA Attempted / unable to insonate.  LEFT No significant stenotic flow. ICA: Moderate plaque. ECA: Minimal plaque. VA: Antegrade/Forward flow.  Transcranial Findings Depth /Mean Velocity / Pulsatility Index Normal Mean Velocity Ranges MCA 40-80PCA 30-55 VA 25-50 ACA    35-60ICA 40-70 BA 25-60  RIGHT MCA: 57/30/1.0. ACA: 68/12/2.4. Terminal ICA: 62/31/1.5. Could not insonate Bilateral OA/Siphon-Patient agitation/AMS. PCA: 60/22/1.0. VA: 60/12/1.2.  LEFT MCA: 57/35/1.6. ACA: 62/29/1.6. Terminal ICA: 61/32/1.8. Could not insonate Bilateral OA/Siphon-Patient agitation/AMS. PCA: 62/18/1.4. VA: 61/16/.71.  Echo 01/04/16 @ [facility] Interpretation Summary The study was technically difficult.  Ejection Fraction = >55%. There is mild mitral regurgitation. There is mild tricuspid regurgitation.  Left Ventricle The left ventricle is grossly normal size. There is no thrombus. Proximal septal thickening is noted. Left ventricular systolic function is normal. Ejection Fraction = >55%. No regional wall motion abnormalities  noted.   Right Ventricle The right ventricle is normal in size and function.  Atria The left atrial size is normal. Right atrial size is normal. The interatrial septum is intact with no evidence for an atrial septal defect.  Mitral Valve The mitral valve leaflets appear thickened, but open well. There is mild mitral regurgitation.   Tricuspid Valve The tricuspid valve is normal in structure and function. There is mild tricuspid regurgitation.  Aortic Valve Aortic valve is sclerotic, non stenotic.  Pulmonic Valve The pulmonic valve is normal in structure and function.  Great Vessels The aortic root is normal size.  Pericardium/Pleural There is no pericardial effusion. Small left pleural effusion.   MMode/2D Measurements & Calculations RVDd: 3.5 cmLVIDd: 4.3 cm IVSd: 0.98 cm LVIDs: 2.6 cm LVPWd: 1.1 cm _____________________________________________________________ LV mass(C)d: 150.1 gramsAo root diam: 3.5 cm  LV mass(C)dI: 84.7 grams/m2 Ao root area: 9.7  cm2 ACS: 2.2 cm LA dimension: 3.8 cm _____________________________________________________________  LVOT diam: 2.1 cm LVOT area: 3.3 cm2  Doppler Measurements & Calculations MV E max vel: 95.6 cm/sec MV P1/2t max vel: 94.6 cm/sec MV A max vel: 102.8 cm/secMV P1/2t: 99.5 msec MV E/A: 0.93 MVA(P1/2t): 2.2 cm2 MV dec slope: 278.4 cm/sec2 _____________________________________________________________  Ao V2 max: 170.4 cm/sec LV V1 max PG: 5.5 mmHg Ao max PG: 11.6 mmHgLV V1 max: 117.4 cm/sec Ao V2 mean: 96.7 cm/sec Ao mean PG: 4.6 mmHg Ao V2 VTI: 30.0 cm AVA(V,D): 2.3 cm2  Cardiac Catheterization: 2002 The left main coronary artery: The left main coronary artery was free of significant disease.  Left anterior descending: The left anterior descending artery was completely occluded.  Circumflex artery: The circumflex artery was a codominant vessel that gave rise to a ramus branch which was completely occluded, a small marginal branch, a second marginal branch, and a large posterolateral branch. There was 70% narrowing in the small marginal branch. There was 90% narrowing in the distal circumflex artery before the posterolateral branch.  The saphenous vein graft to the diagonal branch of the LAD and intermediate branch of the circumflex artery was patent without stenoses. There was 70% narrowing just distal to the anastomosis of the ramus branch.  The LIMA graft to the LAD was patent and functioned well and there was no significant disease in the distal LAD.  LEFT VENTRICULOGRAPHY: The left ventriculogram performed in the RAO projection showed good wall motion with no areas of hypokinesis. The estimated ejection fraction was 60%.  Following stenting of the  mid to distal right coronary lesion the stenosis improved from 90% to 0%.  Following placement of tandem overlying stents in the distal circumflex artery the stenosis improved from 90% to less than 15%.  CONCLUSIONS: 1. Coronary artery disease, status post coronary artery bypass graft surgery    in 1996 with total occlusion of the native left anterior descending, 70%    narrowing in the first marginal branch of the circumflex artery and 90%    stenosis in the distal circumflex artery, 90% stenosis in the mid to distal    right coronary artery, a patent vein graft to the diagonal branch of the    left anterior descending and the ramus branch of the circumflex artery with    70% narrowing after the anastomosis to the ramus branch, and a patent    left internal mammary artery graft to the left anterior descending with    good left ventricular function. 2. Successful stent deployment in the mid to distal right coronary artery with  improvement in percent diameter narrowing from 90% to 0%. 3. Successful placement of tandem overlying stents in the distal circumflex    artery with improvement in percent diameter narrowing from 90% to less    than 15%.  DISPOSITION: The patient was returned to the postangioplasty unit for further observation. The patient will have a higher intense of recurrence at followup because of the long length of the stent in the right coronary artery with a fairly small diameter vessel and because of the need to place tandem overlying stents in the distal circumflex artery.    ASSESSMENT & PLAN:    1. Cellulitis at CABG surgical site - This has been improved significantly after antibiotic treatment. Now surgical wire has came out. Site looks infected without drainage or edema. Patient cannot swallow pills. Discussed with pharmacy to starting on  Keflex suspension. However we will wait until seen by surgeon tomorrow.  2. CAD s/p CABG and PCI of RCA and Cx -  Intermittent chest pain. Likely atypical. Patient has a severe dementia and Alzheimer disease unable to provide accurate history. - He is not on any aspirin or statin therapy.  3. Hx of  of hypertension - Well controlled. Not on any medication.    4. Carotid artery disease - Last carotid doppler 03/2015 showed moderate disease bilaterally. Family not interested in any testing or procedure.   Discussed with DOD. Dr. Rayann Heman. Cardiology follow up PRN. He will benefits from daily aspirin.     Medication Adjustments/Labs and Tests Ordered: Current medicines are reviewed at length with the patient today.  Concerns regarding medicines are outlined above.  Medication changes, Labs and Tests ordered today are listed in the Patient Instructions below. Patient Instructions  Your physician recommends that you continue on your current medications as directed. Please refer to the Current Medication list given to you today.     Your physician recommends that you schedule a follow-up appointment in: AS NEEDED WITH CARDIOLOGY  DR [id number]   12-12-16  TOMORROW   AT  1:30 PM     Weston Brass Sumner, PA  12/11/2016 12:09 PM    [facility] [place], [place], [place]  [address] Phone: [phone]; Fax: [phone]

## 2016-12-11 NOTE — Patient Instructions (Signed)
Your physician recommends that you continue on your current medications as directed. Please refer to the Current Medication list given to you today.     Your physician recommends that you schedule a follow-up appointment in: AS NEEDED WITH CARDIOLOGY  DR [id number]   12-12-16  TOMORROW   AT  1:30 PM

## 2016-12-12 ENCOUNTER — Ambulatory Visit (INDEPENDENT_AMBULATORY_CARE_PROVIDER_SITE_OTHER): Payer: Medicare Other | Admitting: Cardiothoracic Surgery

## 2016-12-12 ENCOUNTER — Ambulatory Visit
Admission: RE | Admit: 2016-12-12 | Discharge: 2016-12-12 | Disposition: A | Discharge status: Home / Self Care | Payer: Medicare Other | Source: Ambulatory Visit | Attending: Cardiothoracic Surgery | Admitting: Cardiothoracic Surgery

## 2016-12-12 ENCOUNTER — Encounter: Payer: Self-pay | Admitting: Cardiothoracic Surgery

## 2016-12-12 ENCOUNTER — Other Ambulatory Visit: Payer: Self-pay | Admitting: *Deleted

## 2016-12-12 VITALS — BP 115/72 | HR 66 | Resp 16 | Ht 68.0 in | Wt 141.0 lb

## 2016-12-12 DIAGNOSIS — Z951 Presence of aortocoronary bypass graft: Secondary | ICD-10-CM | POA: Diagnosis not present

## 2016-12-12 DIAGNOSIS — T849XXA Unspecified complication of internal orthopedic prosthetic device, implant and graft, initial encounter: Secondary | ICD-10-CM | POA: Diagnosis not present

## 2016-12-12 DIAGNOSIS — T8189XA Other complications of procedures, not elsewhere classified, initial encounter: Secondary | ICD-10-CM | POA: Diagnosis not present

## 2016-12-12 NOTE — Progress Notes (Signed)
[address]       [place] [address]             [phone]                    Kristina R Stang [place] Medical Record [id number] Date of Birth: [birth year]  Referring: Helen Hashimoto., MD Primary Care: Helen Hashimoto., MD  Chief Complaint:    Chief Complaint  Patient presents with  . Advice Only    Surgical eval due to exposed sternal wire HX of CABG...CXR    History of Present Illness:    Brandon Shepherd 76 y.o. male is seen in the office  today for Protruding sternal wire, after coronary artery bypass grafting 23 years ago. Currently the patient has severe dementia. Son notes that an area over the upper third of the sternum has broken down several times with a protruding wire.      Current Activity/ Functional Status:  Patient is not independent with mobility/ambulation, transfers, ADL's, IADL's.   Zubrod Score: At the time of surgery this patient's most appropriate activity status/level should be described as: []     0    Normal activity, no symptoms []     1    Restricted in physical strenuous activity but ambulatory, able to do out light work []     2    Ambulatory and capable of self care, unable to do work activities, up and about               >50 % of waking hours                              []     3    Only limited self care, in bed greater than 50% of waking hours [x]     4    Completely disabled, no self care, confined to bed or chair []     5    Moribund   Past Medical History:  Diagnosis Date  . Agitation   . Alzheimer disease   . Angina pectoris ([place])   . Atherosclerosis   . BRADYCARDIA 10/02/2010   Qualifier: Diagnosis of  By: Ron Parker, MD, Caffie Damme   . CAD 06/26/2009   Qualifier: Diagnosis of  By: Ron Parker, MD, Leonidas Romberg Dorinda Hill   . CORONARY ARTERY BYPASS GRAFT, HX OF 06/26/2009   Qualifier: Diagnosis of  By: Ron Parker, MD, Leonidas Romberg Dorinda Hill   . Diabetes mellitus without complication ([place])   . DYSLIPIDEMIA  06/26/2009   Qualifier: Diagnosis of  By: Ron Parker, MD, Leonidas Romberg Dorinda Hill   . Hypertension   . Major depression   . Mouth cancer ([place])   . Osteoarthrosis   . TOBACCO ABUSE 10/02/2010   Qualifier: Diagnosis of  By: Ron Parker, MD, Leonidas Romberg Dorinda Hill   . Vitamin B12 deficiency     Past Surgical History:  Procedure Laterality Date  . CORONARY ARTERY BYPASS GRAFT    . oral cancer      Family History  Problem Relation Age of Onset  . CAD Mother     Social History   Social History  . Marital status: Married    Spouse name: N/A  . Number of children: N/A  . Years of education: N/A   Occupational History  . Not on file.   Social History Main Topics  . Smoking status: Never Smoker  . Smokeless  tobacco: Never Used  . Alcohol use Yes     Comment: moderate  . Drug use: No  . Sexual activity: Yes     Comment: married   Other Topics Concern  . Not on file   Social History Narrative  . No narrative on file    History  Smoking Status  . Never Smoker  Smokeless Tobacco  . Never Used    History  Alcohol Use  . Yes    Comment: moderate     Allergies  Allergen Reactions  . Lorazepam Anxiety    Causes severe agitation Other reaction(s): Agitation Causes severe agitation    Current Outpatient Prescriptions  Medication Sig Dispense Refill  . citalopram (CELEXA) 40 MG tablet Take 40 mg by mouth daily.    Marland Kitchen donepezil (ARICEPT) 10 MG tablet Take 10 mg by mouth at bedtime.    . memantine (NAMENDA) 10 MG tablet Take 10 mg by mouth 2 (two) times daily.    . nitroGLYCERIN (NITROLINGUAL) 0.4 MG/SPRAY spray Place 1 spray under the tongue every 5 (five) minutes x 3 doses as needed for chest pain.    . pantoprazole (PROTONIX) 40 MG tablet Take 40 mg by mouth daily.     No current facility-administered medications for this visit.       Review of Systems:     Cardiac Review of Systems: Y or N  Chest Pain [ n   ]  Resting SOB [ n  ] Exertional SOB  [ n ]  Orthopnea [ n ]     Pedal Edema [   ]    Palpitations [  ] Syncope  [  ]   Presyncope [   ]  General Review of Systems: [Y] = yes [  ]=no Constitional: recent weight change [  ];  Wt loss over the last 3 months [   ] anorexia [  ]; fatigue [  ]; nausea [  ]; night sweats [  ]; fever [  ]; or chills [  ];          Dental: poor dentition[  ]; Last Dentist visit:   Eye : blurred vision [  ]; diplopia [   ]; vision changes [  ];  Amaurosis fugax[  ]; Resp: cough [  ];  wheezing[  ];  hemoptysis[  ]; shortness of breath[  ]; paroxysmal nocturnal dyspnea[  ]; dyspnea on exertion[  ]; or orthopnea[  ];  GI:  gallstones[  ], vomiting[  ];  dysphagia[  ]; melena[  ];  hematochezia [  ]; heartburn[  ];   Hx of  Colonoscopy[  ]; GU: kidney stones [  ]; hematuria[  ];   dysuria [  ];  nocturia[  ];  history of     obstruction [  ]; urinary frequency [  ]             Skin: rash, swelling[  ];, hair loss[  ];  peripheral edema[  ];  or itching[  ]; Musculosketetal: myalgias[  ];  joint swelling[  ];  joint erythema[  ];  joint pain[  ];  back pain[  ];  Heme/Lymph: bruising[  ];  bleeding[  ];  anemia[  ];  Neuro: TIA[  ];  headaches[  ];  stroke[  ];  vertigo[  ];  seizures[  ];   paresthesias[  ];  difficulty walking[  ];  Psych:depression[  ]; anxiety[  ];  Endocrine: diabetes[  ];  thyroid dysfunction[  ];  Immunizations: Flu up to date [  ]; Pneumococcal up to date [  ];  Other:  Physical Exam: BP 115/72 (BP Location: Left Arm, Patient Position: Sitting, Cuff Size: Normal)   Pulse 66   Resp 16   Ht 5\' 8"  (1.727 m)   Wt 141 lb (64 kg)   SpO2 (!) 87% Comment: ON RA  BMI 21.44 kg/m   PHYSICAL EXAMINATION: General appearance: alert, cooperative and no distress Head: Normocephalic, without obvious abnormality, atraumatic Neck: no adenopathy, no carotid bruit, no JVD, supple, symmetrical, trachea midline and thyroid not enlarged, symmetric, no tenderness/mass/nodules Lymph nodes: Cervical, supraclavicular, and axillary  nodes normal. Resp: clear to auscultation bilaterally Back: symmetric, no curvature. ROM normal. No CVA tenderness. Cardio: regular rate and rhythm, S1, S2 normal, no murmur, click, rub or gallop GI: soft, non-tender; bowel sounds normal; no masses,  no organomegaly Extremities: extremities normal, atraumatic, no cyanosis or edema and Homans sign is negative, no sign of DVT Neurologic: Grossly normal but with obvious significant dementia, the patient is "happy" and not combative   Diagnostic Studies & Laboratory data:     Recent Radiology Findings:   Dg Chest 2 View  Result Date: 12/12/2016 CLINICAL DATA:  Exposed sternal wire EXAM: CHEST  2 VIEW COMPARISON:  04/16/2016 FINDINGS: Cardiac shadow is within normal limits. Postsurgical changes are noted. The third sternal wire is oriented upward at its twisting point anteriorly. It appears to appears to the skin surface which is consistent with the patient's given clinical history. The lungs are clear. No acute bony abnormality is noted. IMPRESSION: Exposed third sternal wire as described. The remainder the chest is within normal limits. Electronically Signed   By: Inez Catalina M.D.   On: 12/12/2016 14:15     I have independently reviewed the above radiologic studies.  Recent Lab Findings: Lab Results  Component Value Date   CHOL 119 04/10/2007   TRIG 69 04/10/2007   HDL 40.3 04/10/2007   LDLCALC 65 04/10/2007   ALT 17 04/10/2007   AST 20 04/10/2007      Assessment / Plan:   Exposed sternal - #3- after discussion with the patient's son and his wife about means of removing the wire. They were very concerned about him being in the operating room setting noting that each time he is hospitalized his dementia becomes much worse. We decided to remove the sternal wire under local in the office. Patient's son and wife sign permission for him   In the office with local infiltrate infiltration after cleaning the area carefully around the third  sternal wire we were able to untwist the end of the sternal wire down to freeing it up although we could not pull the entire wire through the bone were able to untwist a wire to the point of cutting it short on each and so it would no longer protrude through the skin  dry dressings were applied. The patient's family was instructed in local wound care now that the wires removed the area of irritation should heal over quickly. There were extracted to return to the office should the wound not complete heal over the next several days.      I  spent 30 minutes counseling the patient face to face and 50% or more the  time was spent in counseling and coordination of care. The total time spent in the appointment was 40 minutes.  Grace Isaac MD      [address] [place] [address] Office [phone]   Beeper [phone]  12/12/2016 5:26 PM

## 2016-12-13 DIAGNOSIS — Z8679 Personal history of other diseases of the circulatory system: Secondary | ICD-10-CM | POA: Diagnosis not present

## 2016-12-13 DIAGNOSIS — G309 Alzheimer's disease, unspecified: Secondary | ICD-10-CM | POA: Diagnosis not present

## 2016-12-18 DIAGNOSIS — G309 Alzheimer's disease, unspecified: Secondary | ICD-10-CM | POA: Diagnosis not present

## 2016-12-18 DIAGNOSIS — Z8679 Personal history of other diseases of the circulatory system: Secondary | ICD-10-CM | POA: Diagnosis not present

## 2016-12-19 DIAGNOSIS — Z8679 Personal history of other diseases of the circulatory system: Secondary | ICD-10-CM | POA: Diagnosis not present

## 2016-12-19 DIAGNOSIS — G309 Alzheimer's disease, unspecified: Secondary | ICD-10-CM | POA: Diagnosis not present

## 2016-12-22 DIAGNOSIS — R531 Weakness: Secondary | ICD-10-CM | POA: Diagnosis not present

## 2016-12-24 DIAGNOSIS — G309 Alzheimer's disease, unspecified: Secondary | ICD-10-CM | POA: Diagnosis not present

## 2016-12-24 DIAGNOSIS — Z8679 Personal history of other diseases of the circulatory system: Secondary | ICD-10-CM | POA: Diagnosis not present

## 2016-12-31 DIAGNOSIS — G309 Alzheimer's disease, unspecified: Secondary | ICD-10-CM | POA: Diagnosis not present

## 2016-12-31 DIAGNOSIS — Z8679 Personal history of other diseases of the circulatory system: Secondary | ICD-10-CM | POA: Diagnosis not present

## 2017-01-02 DIAGNOSIS — G309 Alzheimer's disease, unspecified: Secondary | ICD-10-CM | POA: Diagnosis not present

## 2017-01-02 DIAGNOSIS — Z8679 Personal history of other diseases of the circulatory system: Secondary | ICD-10-CM | POA: Diagnosis not present

## 2017-01-03 DIAGNOSIS — G309 Alzheimer's disease, unspecified: Secondary | ICD-10-CM | POA: Diagnosis not present

## 2017-01-03 DIAGNOSIS — Z8679 Personal history of other diseases of the circulatory system: Secondary | ICD-10-CM | POA: Diagnosis not present

## 2017-01-06 DIAGNOSIS — G309 Alzheimer's disease, unspecified: Secondary | ICD-10-CM | POA: Diagnosis not present

## 2017-01-06 DIAGNOSIS — Z8679 Personal history of other diseases of the circulatory system: Secondary | ICD-10-CM | POA: Diagnosis not present

## 2017-01-08 DIAGNOSIS — Z8679 Personal history of other diseases of the circulatory system: Secondary | ICD-10-CM | POA: Diagnosis not present

## 2017-01-08 DIAGNOSIS — G309 Alzheimer's disease, unspecified: Secondary | ICD-10-CM | POA: Diagnosis not present

## 2017-01-16 DIAGNOSIS — Z8679 Personal history of other diseases of the circulatory system: Secondary | ICD-10-CM | POA: Diagnosis not present

## 2017-01-16 DIAGNOSIS — G309 Alzheimer's disease, unspecified: Secondary | ICD-10-CM | POA: Diagnosis not present

## 2017-01-23 DIAGNOSIS — G309 Alzheimer's disease, unspecified: Secondary | ICD-10-CM | POA: Diagnosis not present

## 2017-01-23 DIAGNOSIS — Z8679 Personal history of other diseases of the circulatory system: Secondary | ICD-10-CM | POA: Diagnosis not present

## 2017-01-27 DIAGNOSIS — G309 Alzheimer's disease, unspecified: Secondary | ICD-10-CM | POA: Diagnosis not present

## 2017-01-27 DIAGNOSIS — Z8679 Personal history of other diseases of the circulatory system: Secondary | ICD-10-CM | POA: Diagnosis not present

## 2017-02-02 DIAGNOSIS — G309 Alzheimer's disease, unspecified: Secondary | ICD-10-CM | POA: Diagnosis not present

## 2017-02-02 DIAGNOSIS — Z8679 Personal history of other diseases of the circulatory system: Secondary | ICD-10-CM | POA: Diagnosis not present

## 2017-02-03 DIAGNOSIS — Z8679 Personal history of other diseases of the circulatory system: Secondary | ICD-10-CM | POA: Diagnosis not present

## 2017-02-03 DIAGNOSIS — G309 Alzheimer's disease, unspecified: Secondary | ICD-10-CM | POA: Diagnosis not present

## 2017-02-04 DIAGNOSIS — G309 Alzheimer's disease, unspecified: Secondary | ICD-10-CM | POA: Diagnosis not present

## 2017-02-04 DIAGNOSIS — Z8679 Personal history of other diseases of the circulatory system: Secondary | ICD-10-CM | POA: Diagnosis not present

## 2017-02-05 DIAGNOSIS — G309 Alzheimer's disease, unspecified: Secondary | ICD-10-CM | POA: Diagnosis not present

## 2017-02-05 DIAGNOSIS — Z8679 Personal history of other diseases of the circulatory system: Secondary | ICD-10-CM | POA: Diagnosis not present

## 2017-02-07 DIAGNOSIS — Z8679 Personal history of other diseases of the circulatory system: Secondary | ICD-10-CM | POA: Diagnosis not present

## 2017-02-07 DIAGNOSIS — G309 Alzheimer's disease, unspecified: Secondary | ICD-10-CM | POA: Diagnosis not present

## 2017-03-04 DIAGNOSIS — R531 Weakness: Secondary | ICD-10-CM | POA: Diagnosis not present

## 2017-04-04 DIAGNOSIS — R531 Weakness: Secondary | ICD-10-CM | POA: Diagnosis not present

## 2017-04-17 DIAGNOSIS — G309 Alzheimer's disease, unspecified: Secondary | ICD-10-CM | POA: Diagnosis not present

## 2017-04-17 DIAGNOSIS — R29898 Other symptoms and signs involving the musculoskeletal system: Secondary | ICD-10-CM | POA: Diagnosis not present

## 2017-04-17 DIAGNOSIS — Z6820 Body mass index (BMI) 20.0-20.9, adult: Secondary | ICD-10-CM | POA: Diagnosis not present

## 2017-04-21 DIAGNOSIS — Z9181 History of falling: Secondary | ICD-10-CM | POA: Diagnosis not present

## 2017-04-21 DIAGNOSIS — E1165 Type 2 diabetes mellitus with hyperglycemia: Secondary | ICD-10-CM | POA: Diagnosis not present

## 2017-04-21 DIAGNOSIS — M19019 Primary osteoarthritis, unspecified shoulder: Secondary | ICD-10-CM | POA: Diagnosis not present

## 2017-04-21 DIAGNOSIS — G309 Alzheimer's disease, unspecified: Secondary | ICD-10-CM | POA: Diagnosis not present

## 2017-04-21 DIAGNOSIS — I10 Essential (primary) hypertension: Secondary | ICD-10-CM | POA: Diagnosis not present

## 2017-04-21 DIAGNOSIS — I25118 Atherosclerotic heart disease of native coronary artery with other forms of angina pectoris: Secondary | ICD-10-CM | POA: Diagnosis not present

## 2017-04-21 DIAGNOSIS — R2681 Unsteadiness on feet: Secondary | ICD-10-CM | POA: Diagnosis not present

## 2017-04-21 DIAGNOSIS — Z951 Presence of aortocoronary bypass graft: Secondary | ICD-10-CM | POA: Diagnosis not present

## 2017-04-28 DIAGNOSIS — G309 Alzheimer's disease, unspecified: Secondary | ICD-10-CM | POA: Diagnosis not present

## 2017-04-28 DIAGNOSIS — Z951 Presence of aortocoronary bypass graft: Secondary | ICD-10-CM | POA: Diagnosis not present

## 2017-04-28 DIAGNOSIS — M19019 Primary osteoarthritis, unspecified shoulder: Secondary | ICD-10-CM | POA: Diagnosis not present

## 2017-04-28 DIAGNOSIS — R2681 Unsteadiness on feet: Secondary | ICD-10-CM | POA: Diagnosis not present

## 2017-04-28 DIAGNOSIS — I25118 Atherosclerotic heart disease of native coronary artery with other forms of angina pectoris: Secondary | ICD-10-CM | POA: Diagnosis not present

## 2017-04-28 DIAGNOSIS — E1165 Type 2 diabetes mellitus with hyperglycemia: Secondary | ICD-10-CM | POA: Diagnosis not present

## 2017-04-28 DIAGNOSIS — Z9181 History of falling: Secondary | ICD-10-CM | POA: Diagnosis not present

## 2017-04-28 DIAGNOSIS — I10 Essential (primary) hypertension: Secondary | ICD-10-CM | POA: Diagnosis not present

## 2017-05-04 DIAGNOSIS — R531 Weakness: Secondary | ICD-10-CM | POA: Diagnosis not present

## 2017-05-13 DIAGNOSIS — M19019 Primary osteoarthritis, unspecified shoulder: Secondary | ICD-10-CM | POA: Diagnosis not present

## 2017-05-13 DIAGNOSIS — E1165 Type 2 diabetes mellitus with hyperglycemia: Secondary | ICD-10-CM | POA: Diagnosis not present

## 2017-05-13 DIAGNOSIS — Z9181 History of falling: Secondary | ICD-10-CM | POA: Diagnosis not present

## 2017-05-13 DIAGNOSIS — Z951 Presence of aortocoronary bypass graft: Secondary | ICD-10-CM | POA: Diagnosis not present

## 2017-05-13 DIAGNOSIS — G309 Alzheimer's disease, unspecified: Secondary | ICD-10-CM | POA: Diagnosis not present

## 2017-05-13 DIAGNOSIS — R2681 Unsteadiness on feet: Secondary | ICD-10-CM | POA: Diagnosis not present

## 2017-05-13 DIAGNOSIS — I10 Essential (primary) hypertension: Secondary | ICD-10-CM | POA: Diagnosis not present

## 2017-05-13 DIAGNOSIS — I25118 Atherosclerotic heart disease of native coronary artery with other forms of angina pectoris: Secondary | ICD-10-CM | POA: Diagnosis not present

## 2017-05-23 DIAGNOSIS — Z9181 History of falling: Secondary | ICD-10-CM | POA: Diagnosis not present

## 2017-05-23 DIAGNOSIS — R2681 Unsteadiness on feet: Secondary | ICD-10-CM | POA: Diagnosis not present

## 2017-05-23 DIAGNOSIS — M19019 Primary osteoarthritis, unspecified shoulder: Secondary | ICD-10-CM | POA: Diagnosis not present

## 2017-05-23 DIAGNOSIS — I25118 Atherosclerotic heart disease of native coronary artery with other forms of angina pectoris: Secondary | ICD-10-CM | POA: Diagnosis not present

## 2017-05-23 DIAGNOSIS — Z951 Presence of aortocoronary bypass graft: Secondary | ICD-10-CM | POA: Diagnosis not present

## 2017-05-23 DIAGNOSIS — E1165 Type 2 diabetes mellitus with hyperglycemia: Secondary | ICD-10-CM | POA: Diagnosis not present

## 2017-05-23 DIAGNOSIS — G309 Alzheimer's disease, unspecified: Secondary | ICD-10-CM | POA: Diagnosis not present

## 2017-05-23 DIAGNOSIS — I10 Essential (primary) hypertension: Secondary | ICD-10-CM | POA: Diagnosis not present

## 2017-06-04 DIAGNOSIS — R531 Weakness: Secondary | ICD-10-CM | POA: Diagnosis not present

## 2017-07-05 DIAGNOSIS — R531 Weakness: Secondary | ICD-10-CM | POA: Diagnosis not present

## 2017-08-04 DIAGNOSIS — R531 Weakness: Secondary | ICD-10-CM | POA: Diagnosis not present

## 2017-09-04 DIAGNOSIS — R531 Weakness: Secondary | ICD-10-CM | POA: Diagnosis not present

## 2017-10-04 DIAGNOSIS — R531 Weakness: Secondary | ICD-10-CM | POA: Diagnosis not present

## 2017-11-03 DIAGNOSIS — E860 Dehydration: Secondary | ICD-10-CM | POA: Diagnosis not present

## 2017-11-03 DIAGNOSIS — I252 Old myocardial infarction: Secondary | ICD-10-CM | POA: Diagnosis not present

## 2017-11-03 DIAGNOSIS — S199XXA Unspecified injury of neck, initial encounter: Secondary | ICD-10-CM | POA: Diagnosis not present

## 2017-11-03 DIAGNOSIS — R55 Syncope and collapse: Secondary | ICD-10-CM | POA: Diagnosis not present

## 2017-11-03 DIAGNOSIS — I2510 Atherosclerotic heart disease of native coronary artery without angina pectoris: Secondary | ICD-10-CM | POA: Diagnosis not present

## 2017-11-03 DIAGNOSIS — Z79899 Other long term (current) drug therapy: Secondary | ICD-10-CM | POA: Diagnosis not present

## 2017-11-03 DIAGNOSIS — I951 Orthostatic hypotension: Secondary | ICD-10-CM | POA: Diagnosis not present

## 2017-11-03 DIAGNOSIS — S0990XA Unspecified injury of head, initial encounter: Secondary | ICD-10-CM | POA: Diagnosis not present

## 2017-11-03 DIAGNOSIS — R404 Transient alteration of awareness: Secondary | ICD-10-CM | POA: Diagnosis not present

## 2017-11-03 DIAGNOSIS — R42 Dizziness and giddiness: Secondary | ICD-10-CM | POA: Diagnosis not present

## 2017-11-03 DIAGNOSIS — I10 Essential (primary) hypertension: Secondary | ICD-10-CM | POA: Diagnosis not present

## 2017-11-03 DIAGNOSIS — Z951 Presence of aortocoronary bypass graft: Secondary | ICD-10-CM | POA: Diagnosis not present

## 2017-11-03 DIAGNOSIS — J449 Chronic obstructive pulmonary disease, unspecified: Secondary | ICD-10-CM | POA: Diagnosis not present

## 2017-11-04 DIAGNOSIS — R531 Weakness: Secondary | ICD-10-CM | POA: Diagnosis not present

## 2017-12-05 DIAGNOSIS — R531 Weakness: Secondary | ICD-10-CM | POA: Diagnosis not present

## 2018-01-02 DIAGNOSIS — R531 Weakness: Secondary | ICD-10-CM | POA: Diagnosis not present

## 2018-02-02 DIAGNOSIS — R531 Weakness: Secondary | ICD-10-CM | POA: Diagnosis not present

## 2018-03-04 DIAGNOSIS — R531 Weakness: Secondary | ICD-10-CM | POA: Diagnosis not present

## 2018-05-20 DIAGNOSIS — Z1339 Encounter for screening examination for other mental health and behavioral disorders: Secondary | ICD-10-CM | POA: Diagnosis not present

## 2018-05-20 DIAGNOSIS — Z0000 Encounter for general adult medical examination without abnormal findings: Secondary | ICD-10-CM | POA: Diagnosis not present

## 2018-05-20 DIAGNOSIS — Z125 Encounter for screening for malignant neoplasm of prostate: Secondary | ICD-10-CM | POA: Diagnosis not present

## 2018-05-20 DIAGNOSIS — E785 Hyperlipidemia, unspecified: Secondary | ICD-10-CM | POA: Diagnosis not present

## 2018-07-30 DIAGNOSIS — G309 Alzheimer's disease, unspecified: Secondary | ICD-10-CM | POA: Diagnosis not present

## 2018-07-30 DIAGNOSIS — F329 Major depressive disorder, single episode, unspecified: Secondary | ICD-10-CM | POA: Diagnosis not present

## 2018-07-30 DIAGNOSIS — I2510 Atherosclerotic heart disease of native coronary artery without angina pectoris: Secondary | ICD-10-CM | POA: Diagnosis not present

## 2018-07-30 DIAGNOSIS — F0281 Dementia in other diseases classified elsewhere with behavioral disturbance: Secondary | ICD-10-CM | POA: Diagnosis not present

## 2018-07-30 DIAGNOSIS — E119 Type 2 diabetes mellitus without complications: Secondary | ICD-10-CM | POA: Diagnosis not present

## 2018-07-30 DIAGNOSIS — I10 Essential (primary) hypertension: Secondary | ICD-10-CM | POA: Diagnosis not present

## 2018-08-01 DIAGNOSIS — F329 Major depressive disorder, single episode, unspecified: Secondary | ICD-10-CM | POA: Diagnosis not present

## 2018-08-01 DIAGNOSIS — E119 Type 2 diabetes mellitus without complications: Secondary | ICD-10-CM | POA: Diagnosis not present

## 2018-08-01 DIAGNOSIS — G309 Alzheimer's disease, unspecified: Secondary | ICD-10-CM | POA: Diagnosis not present

## 2018-08-01 DIAGNOSIS — F0281 Dementia in other diseases classified elsewhere with behavioral disturbance: Secondary | ICD-10-CM | POA: Diagnosis not present

## 2018-08-01 DIAGNOSIS — I2510 Atherosclerotic heart disease of native coronary artery without angina pectoris: Secondary | ICD-10-CM | POA: Diagnosis not present

## 2018-08-01 DIAGNOSIS — I10 Essential (primary) hypertension: Secondary | ICD-10-CM | POA: Diagnosis not present

## 2018-08-04 DIAGNOSIS — F329 Major depressive disorder, single episode, unspecified: Secondary | ICD-10-CM | POA: Diagnosis not present

## 2018-08-04 DIAGNOSIS — F0281 Dementia in other diseases classified elsewhere with behavioral disturbance: Secondary | ICD-10-CM | POA: Diagnosis not present

## 2018-08-04 DIAGNOSIS — I2510 Atherosclerotic heart disease of native coronary artery without angina pectoris: Secondary | ICD-10-CM | POA: Diagnosis not present

## 2018-08-04 DIAGNOSIS — I10 Essential (primary) hypertension: Secondary | ICD-10-CM | POA: Diagnosis not present

## 2018-08-04 DIAGNOSIS — G309 Alzheimer's disease, unspecified: Secondary | ICD-10-CM | POA: Diagnosis not present

## 2018-08-04 DIAGNOSIS — E119 Type 2 diabetes mellitus without complications: Secondary | ICD-10-CM | POA: Diagnosis not present

## 2018-08-07 DIAGNOSIS — I2510 Atherosclerotic heart disease of native coronary artery without angina pectoris: Secondary | ICD-10-CM | POA: Diagnosis not present

## 2018-08-07 DIAGNOSIS — I10 Essential (primary) hypertension: Secondary | ICD-10-CM | POA: Diagnosis not present

## 2018-08-07 DIAGNOSIS — E119 Type 2 diabetes mellitus without complications: Secondary | ICD-10-CM | POA: Diagnosis not present

## 2018-08-07 DIAGNOSIS — G309 Alzheimer's disease, unspecified: Secondary | ICD-10-CM | POA: Diagnosis not present

## 2018-08-07 DIAGNOSIS — F329 Major depressive disorder, single episode, unspecified: Secondary | ICD-10-CM | POA: Diagnosis not present

## 2018-08-07 DIAGNOSIS — F0281 Dementia in other diseases classified elsewhere with behavioral disturbance: Secondary | ICD-10-CM | POA: Diagnosis not present

## 2018-08-13 DIAGNOSIS — I10 Essential (primary) hypertension: Secondary | ICD-10-CM | POA: Diagnosis not present

## 2018-08-13 DIAGNOSIS — E119 Type 2 diabetes mellitus without complications: Secondary | ICD-10-CM | POA: Diagnosis not present

## 2018-08-13 DIAGNOSIS — F329 Major depressive disorder, single episode, unspecified: Secondary | ICD-10-CM | POA: Diagnosis not present

## 2018-08-13 DIAGNOSIS — F0281 Dementia in other diseases classified elsewhere with behavioral disturbance: Secondary | ICD-10-CM | POA: Diagnosis not present

## 2018-08-13 DIAGNOSIS — I2510 Atherosclerotic heart disease of native coronary artery without angina pectoris: Secondary | ICD-10-CM | POA: Diagnosis not present

## 2018-08-13 DIAGNOSIS — G309 Alzheimer's disease, unspecified: Secondary | ICD-10-CM | POA: Diagnosis not present

## 2018-08-19 DIAGNOSIS — E119 Type 2 diabetes mellitus without complications: Secondary | ICD-10-CM | POA: Diagnosis not present

## 2018-08-19 DIAGNOSIS — I10 Essential (primary) hypertension: Secondary | ICD-10-CM | POA: Diagnosis not present

## 2018-08-19 DIAGNOSIS — F0281 Dementia in other diseases classified elsewhere with behavioral disturbance: Secondary | ICD-10-CM | POA: Diagnosis not present

## 2018-08-19 DIAGNOSIS — G309 Alzheimer's disease, unspecified: Secondary | ICD-10-CM | POA: Diagnosis not present

## 2018-08-19 DIAGNOSIS — F329 Major depressive disorder, single episode, unspecified: Secondary | ICD-10-CM | POA: Diagnosis not present

## 2018-08-19 DIAGNOSIS — I2510 Atherosclerotic heart disease of native coronary artery without angina pectoris: Secondary | ICD-10-CM | POA: Diagnosis not present

## 2018-08-25 DIAGNOSIS — I2510 Atherosclerotic heart disease of native coronary artery without angina pectoris: Secondary | ICD-10-CM | POA: Diagnosis not present

## 2018-08-25 DIAGNOSIS — E119 Type 2 diabetes mellitus without complications: Secondary | ICD-10-CM | POA: Diagnosis not present

## 2018-08-25 DIAGNOSIS — I10 Essential (primary) hypertension: Secondary | ICD-10-CM | POA: Diagnosis not present

## 2018-08-25 DIAGNOSIS — G309 Alzheimer's disease, unspecified: Secondary | ICD-10-CM | POA: Diagnosis not present

## 2018-08-25 DIAGNOSIS — F329 Major depressive disorder, single episode, unspecified: Secondary | ICD-10-CM | POA: Diagnosis not present

## 2018-08-25 DIAGNOSIS — F0281 Dementia in other diseases classified elsewhere with behavioral disturbance: Secondary | ICD-10-CM | POA: Diagnosis not present

## 2018-08-30 DIAGNOSIS — R0602 Shortness of breath: Secondary | ICD-10-CM | POA: Diagnosis not present

## 2018-08-30 DIAGNOSIS — F0390 Unspecified dementia without behavioral disturbance: Secondary | ICD-10-CM | POA: Diagnosis not present

## 2018-08-30 DIAGNOSIS — R0689 Other abnormalities of breathing: Secondary | ICD-10-CM | POA: Diagnosis not present

## 2018-08-30 DIAGNOSIS — R4020 Unspecified coma: Secondary | ICD-10-CM | POA: Diagnosis not present

## 2018-08-30 DIAGNOSIS — N179 Acute kidney failure, unspecified: Secondary | ICD-10-CM | POA: Diagnosis not present

## 2018-08-30 DIAGNOSIS — Z7401 Bed confinement status: Secondary | ICD-10-CM | POA: Diagnosis not present

## 2018-08-30 DIAGNOSIS — R404 Transient alteration of awareness: Secondary | ICD-10-CM | POA: Diagnosis not present

## 2018-08-30 DIAGNOSIS — Z743 Need for continuous supervision: Secondary | ICD-10-CM | POA: Diagnosis not present

## 2018-08-30 DIAGNOSIS — I214 Non-ST elevation (NSTEMI) myocardial infarction: Secondary | ICD-10-CM | POA: Diagnosis not present

## 2018-08-30 DIAGNOSIS — N390 Urinary tract infection, site not specified: Secondary | ICD-10-CM | POA: Diagnosis not present

## 2018-08-30 DIAGNOSIS — J690 Pneumonitis due to inhalation of food and vomit: Secondary | ICD-10-CM | POA: Diagnosis not present

## 2018-08-30 DIAGNOSIS — I499 Cardiac arrhythmia, unspecified: Secondary | ICD-10-CM | POA: Diagnosis not present

## 2018-08-30 DIAGNOSIS — I2510 Atherosclerotic heart disease of native coronary artery without angina pectoris: Secondary | ICD-10-CM | POA: Diagnosis not present

## 2018-08-30 DIAGNOSIS — N183 Chronic kidney disease, stage 3 (moderate): Secondary | ICD-10-CM | POA: Diagnosis not present

## 2018-08-30 DIAGNOSIS — R6521 Severe sepsis with septic shock: Secondary | ICD-10-CM | POA: Diagnosis not present

## 2018-08-30 DIAGNOSIS — R7989 Other specified abnormal findings of blood chemistry: Secondary | ICD-10-CM | POA: Diagnosis not present

## 2018-08-30 DIAGNOSIS — J9690 Respiratory failure, unspecified, unspecified whether with hypoxia or hypercapnia: Secondary | ICD-10-CM | POA: Diagnosis not present

## 2018-08-30 DIAGNOSIS — J189 Pneumonia, unspecified organism: Secondary | ICD-10-CM | POA: Diagnosis not present

## 2018-08-30 DIAGNOSIS — R0902 Hypoxemia: Secondary | ICD-10-CM | POA: Diagnosis not present

## 2018-08-30 DIAGNOSIS — R918 Other nonspecific abnormal finding of lung field: Secondary | ICD-10-CM | POA: Diagnosis not present

## 2018-08-30 DIAGNOSIS — F0281 Dementia in other diseases classified elsewhere with behavioral disturbance: Secondary | ICD-10-CM | POA: Diagnosis not present

## 2018-08-30 DIAGNOSIS — R4182 Altered mental status, unspecified: Secondary | ICD-10-CM | POA: Diagnosis not present

## 2018-08-30 DIAGNOSIS — E43 Unspecified severe protein-calorie malnutrition: Secondary | ICD-10-CM | POA: Diagnosis not present

## 2018-08-30 DIAGNOSIS — A419 Sepsis, unspecified organism: Secondary | ICD-10-CM | POA: Diagnosis not present

## 2018-08-30 DIAGNOSIS — I340 Nonrheumatic mitral (valve) insufficiency: Secondary | ICD-10-CM | POA: Diagnosis not present

## 2018-08-31 DIAGNOSIS — I214 Non-ST elevation (NSTEMI) myocardial infarction: Secondary | ICD-10-CM

## 2018-08-31 DIAGNOSIS — I340 Nonrheumatic mitral (valve) insufficiency: Secondary | ICD-10-CM

## 2018-09-01 DIAGNOSIS — R7989 Other specified abnormal findings of blood chemistry: Secondary | ICD-10-CM

## 2018-09-02 DIAGNOSIS — A419 Sepsis, unspecified organism: Secondary | ICD-10-CM

## 2018-10-04 DEATH — deceased

## 2018-12-10 IMAGING — DX DG CHEST 2V
2 series · 2 of 2 positions shown · non-contrast
Comparison: 04/16/2016

CLINICAL DATA: Exposed sternal wire

EXAM:
CHEST  2 VIEW

[dg chest 2 view (1 of 2)]
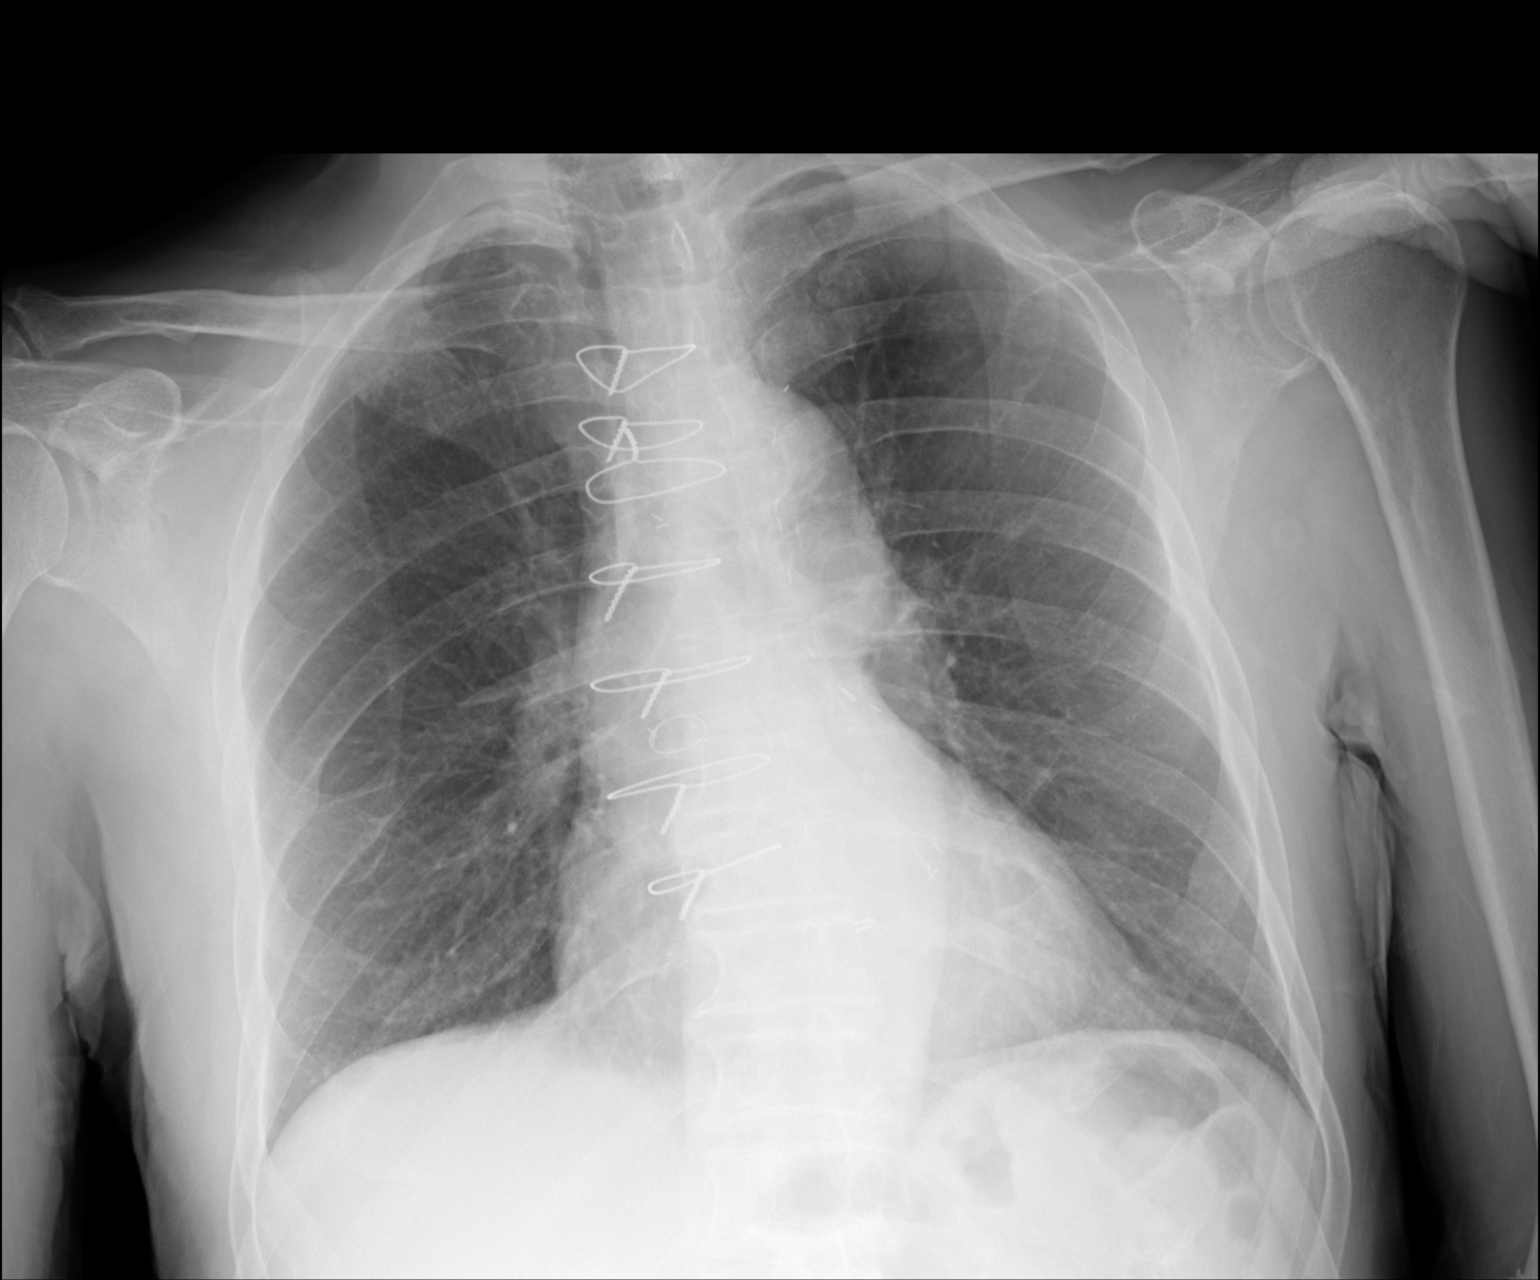

[dg chest 2 view (2 of 2)]
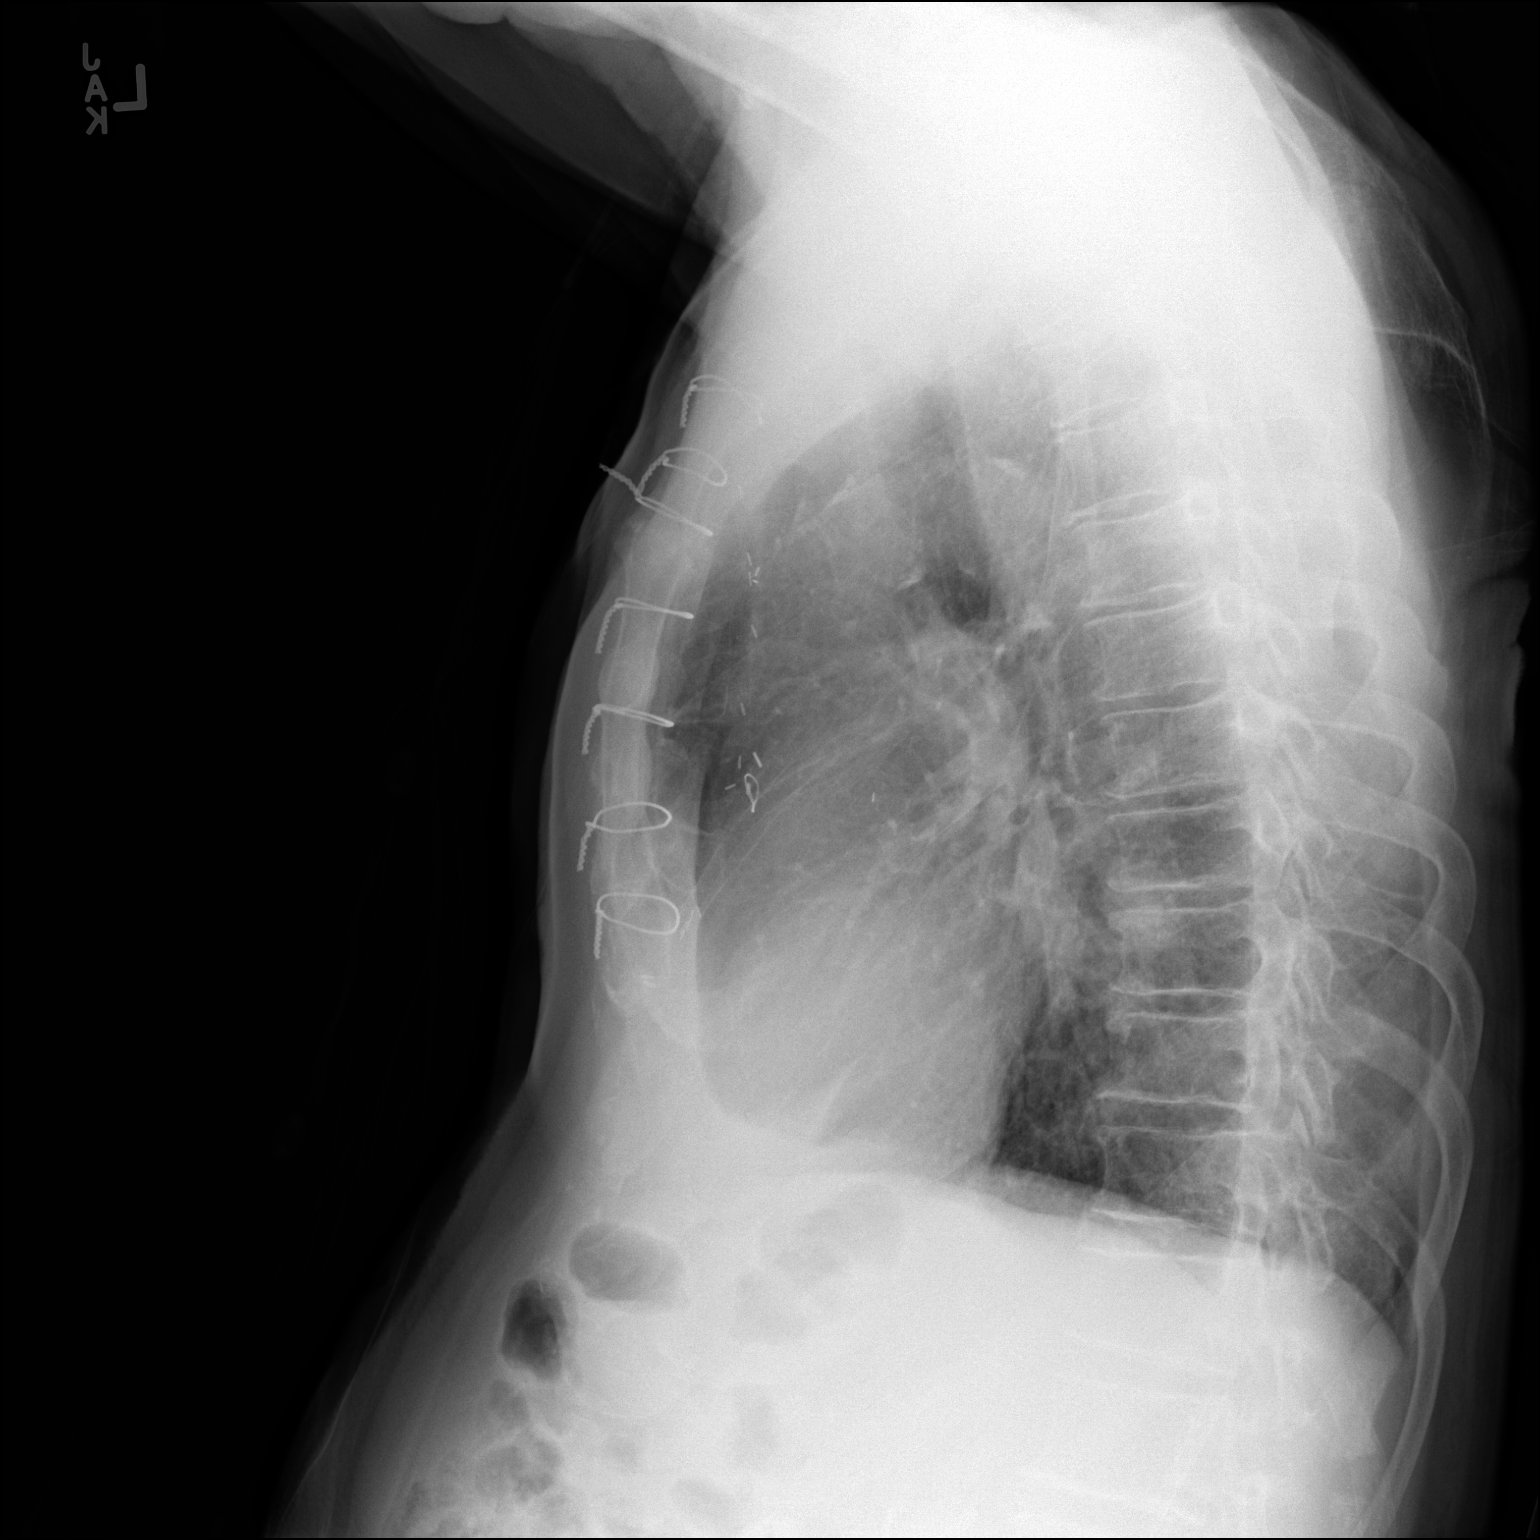

[2 of 2 positions shown; findings below may reference images not displayed]

FINDINGS: Cardiac shadow is within normal limits. Postsurgical changes are
noted. The third sternal wire is oriented upward at its twisting
point anteriorly. It appears to appears to the skin surface which is
consistent with the patient's given clinical history. The lungs are
clear. No acute bony abnormality is noted.
IMPRESSION: Exposed third sternal wire as described. The remainder the chest is
within normal limits.
